# Patient Record
Sex: Male | Born: 1965 | Hispanic: Yes | Marital: Married | Smoking: Never smoker
Health system: Southern US, Community
[De-identification: ages and names within clinical notes are randomized; demographics above are authoritative.]

---

## 2020-06-25 ENCOUNTER — Emergency Department (HOSPITAL_COMMUNITY)
Admission: EM | Admit: 2020-06-25 | Discharge: 2020-06-25 | Disposition: A | Discharge status: Home / Self Care | Payer: BC Managed Care – PPO | Attending: Emergency Medicine | Admitting: Emergency Medicine

## 2020-06-25 ENCOUNTER — Other Ambulatory Visit: Payer: Self-pay

## 2020-06-25 ENCOUNTER — Emergency Department (HOSPITAL_COMMUNITY): Payer: BC Managed Care – PPO

## 2020-06-25 DIAGNOSIS — R079 Chest pain, unspecified: Secondary | ICD-10-CM | POA: Diagnosis not present

## 2020-06-25 LAB — BASIC METABOLIC PANEL
Anion gap: 10 (ref 5–15)
BUN: 16 mg/dL (ref 6–20)
CO2: 22 mmol/L (ref 22–32)
Calcium: 9.2 mg/dL (ref 8.9–10.3)
Chloride: 104 mmol/L (ref 98–111)
Creatinine, Ser: 0.86 mg/dL (ref 0.61–1.24)
GFR calc Af Amer: >60 mL/min (ref >60)
GFR calc non Af Amer: >60 mL/min (ref >60)
Glucose, Bld: 83 mg/dL (ref 70–99)
Potassium: 4.5 mmol/L (ref 3.5–5.1)
Sodium: 136 mmol/L (ref 135–145)

## 2020-06-25 LAB — CBC
HCT: 43.5 % (ref 39.0–52.0)
Hemoglobin: 14.7 g/dL (ref 13.0–17.0)
MCH: 29.1 pg (ref 26.0–34.0)
MCHC: 33.8 g/dL (ref 30.0–36.0)
MCV: 86 fL (ref 80.0–100.0)
Platelets: 178 10*3/uL (ref 150–400)
RBC: 5.06 MIL/uL (ref 4.22–5.81)
RDW: 13.3 % (ref 11.5–15.5)
WBC: 7.4 10*3/uL (ref 4.0–10.5)
nRBC: 0 % (ref 0.0–0.2)

## 2020-06-25 LAB — TROPONIN I (HIGH SENSITIVITY)
Troponin I (High Sensitivity): 2 ng/L (ref <18)
Troponin I (High Sensitivity): 3 ng/L (ref <18)

## 2020-06-25 MED ORDER — SODIUM CHLORIDE 0.9% FLUSH: 3.0000 mL | Freq: Once | INTRAVENOUS | Status: DC

## 2020-06-25 NOTE — ED Triage Notes (Signed)
Patient reports chest pain x2 days. Rated 4/10. Says pain radiates toward back. Patient says pain is worse at night.

## 2020-07-02 NOTE — ED Provider Notes (Signed)
[facility]-EMERGENCY DEPT Provider Note   CSN: [id number] Arrival date & time: 06/25/20  1010     History Chief Complaint  Patient presents with  . Chest Pain    Nicolas Grant is a 54 y.o. male.  HPI   54 year old male with chest pain.  Onset about 2 days ago.  Cannot remember what he was specifically doing when the pain started.  Pain radiates into the back.  Seems to be worse at night.  Not noticed any other appreciable exacerbating leaving factors.  No fevers or chills.  No dyspnea.  No unusual leg pain or swelling.   No past medical history on file.  There are no problems to display for this patient.  No family history on file.  Social History   Tobacco Use  . Smoking status: Not on file  Substance Use Topics  . Alcohol use: Not on file  . Drug use: Not on file    Home Medications Prior to Admission medications   Not on File    Allergies    Patient has no known allergies.  Review of Systems   Review of Systems All systems reviewed and negative, other than as noted in HPI.  Physical Exam Updated Vital Signs BP 125/88 (BP Location: Right Arm)   Pulse (!) 53   Temp 97.6 F (36.4 C) (Oral)   Resp 17   Ht 5\' 5"  (1.651 m)   Wt 72.6 kg   SpO2 98%   BMI 26.63 kg/m   Physical Exam Vitals and nursing note reviewed.  Constitutional:      General: He is not in acute distress.    Appearance: He is well-developed.  HENT:     Head: Normocephalic and atraumatic.  Eyes:     General:        Right eye: No discharge.        Left eye: No discharge.     Conjunctiva/sclera: Conjunctivae normal.  Cardiovascular:     Rate and Rhythm: Normal rate and regular rhythm.     Heart sounds: Normal heart sounds. No murmur heard.  No friction rub. No gallop.   Pulmonary:     Effort: Pulmonary effort is normal. No respiratory distress.     Breath sounds: Normal breath sounds.  Abdominal:     General: There is no distension.     Palpations:  Abdomen is soft.     Tenderness: There is no abdominal tenderness.  Musculoskeletal:        General: No tenderness.     Cervical back: Neck supple.     Comments: Lower extremities symmetric as compared to each other. No calf tenderness. Negative Homan's. No palpable cords.   Skin:    General: Skin is warm and dry.  Neurological:     Mental Status: He is alert.  Psychiatric:        Behavior: Behavior normal.        Thought Content: Thought content normal.     ED Results / Procedures / Treatments   Labs (all labs ordered are listed, but only abnormal results are displayed) Labs Reviewed  BASIC METABOLIC PANEL  CBC  TROPONIN I (HIGH SENSITIVITY)  TROPONIN I (HIGH SENSITIVITY)    EKG EKG Interpretation  Date/Time:  Sunday June 25 2020 10:29:14 EDT Ventricular Rate:  62 PR Interval:    QRS Duration: 97 QT Interval:  389 QTC Calculation: 395 R Axis:   79 Text Interpretation: Sinus rhythm No old tracing to compare Confirmed  by Raeford Razor [phone]) on 06/25/2020 11:01:06 AM   Radiology No results found.  Procedures Procedures (including critical care time)  Medications Ordered in ED Medications - No data to display  ED Course  I have reviewed the triage vital signs and the nursing notes.  Pertinent labs & imaging results that were available during my care of the patient were reviewed by me and considered in my medical decision making (see chart for details).    MDM Rules/Calculators/A&P                          54 year old male with chest pain.  Doubt ACS.  Atypical symptoms.  Doubt PE, dissection or other emergent process.  At this point I think he probably needs a stress test.  Close outpatient follow-up.  Return precautions discussed.    Final Clinical Impression(s) / ED Diagnoses Final diagnoses:  Chest pain, unspecified type    Rx / DC Orders ED Discharge Orders    None       Raeford Razor, MD 07/02/20 1228

## 2021-02-13 IMAGING — CR DG CHEST 2V
2 series · 2 of 2 positions shown · non-contrast
Comparison: None.

CLINICAL DATA: Chest pain

EXAM:
CHEST - 2 VIEW

[w chest pa]
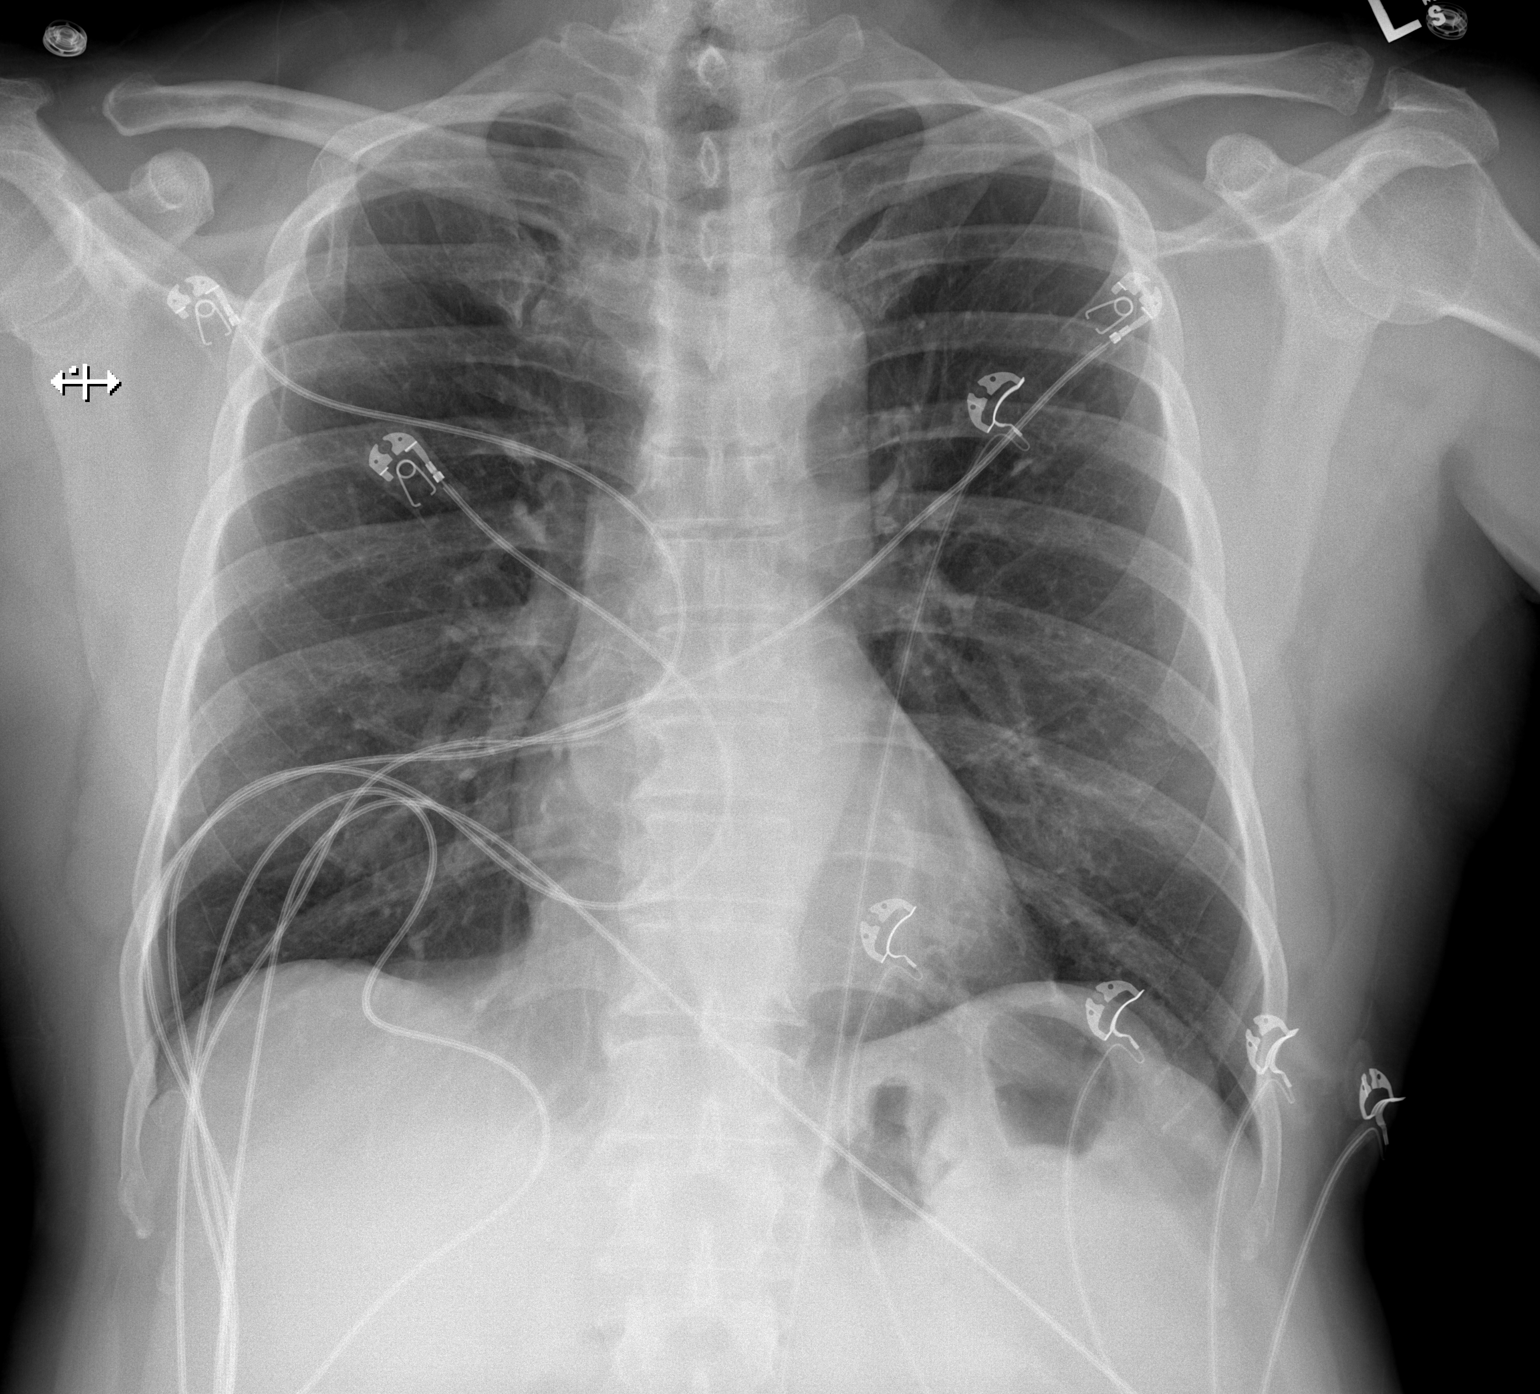

[w chest lat]
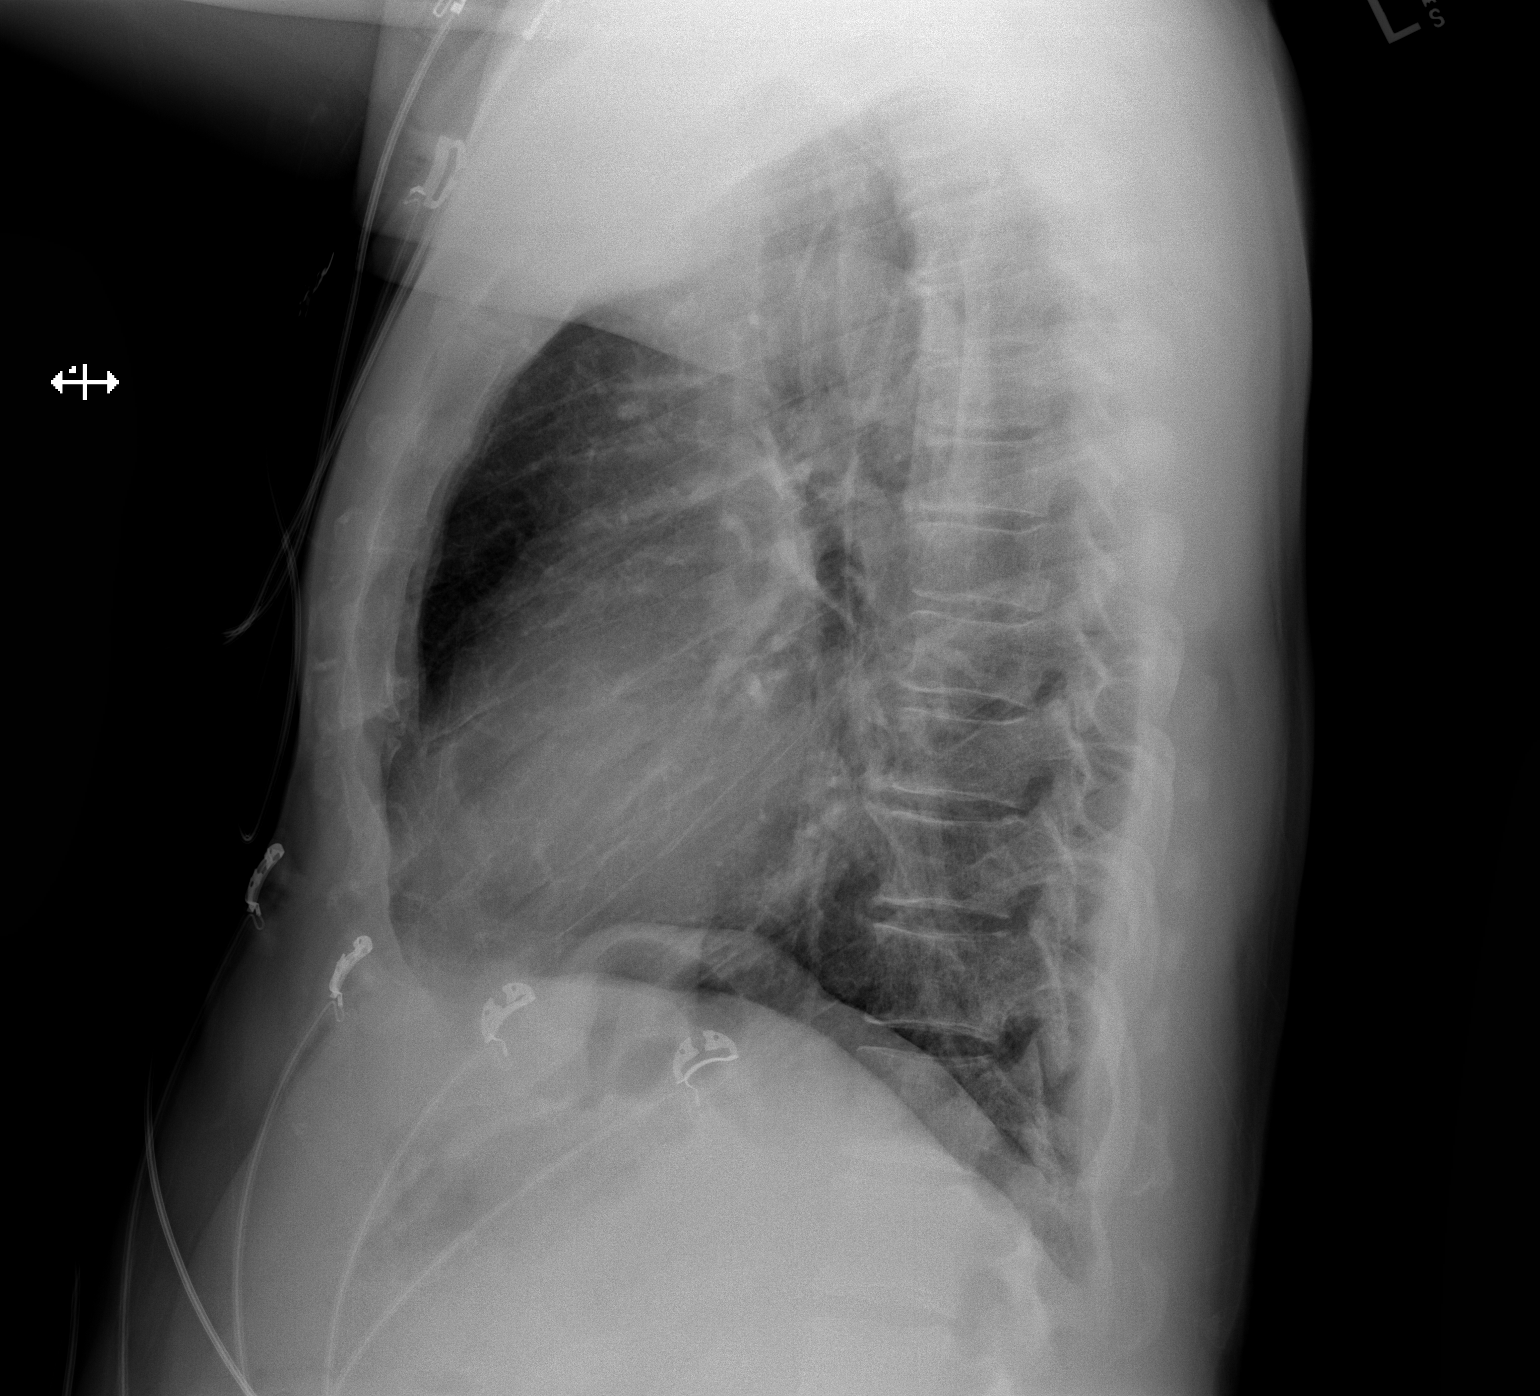

[2 of 2 positions shown; findings below may reference images not displayed]

FINDINGS: No pneumothorax. The heart, hila, mediastinum, lungs, and pleura are
normal. No acute abnormalities.
IMPRESSION: No active cardiopulmonary disease.

## 2022-11-02 ENCOUNTER — Encounter (HOSPITAL_BASED_OUTPATIENT_CLINIC_OR_DEPARTMENT_OTHER): Payer: Self-pay | Admitting: Emergency Medicine

## 2022-11-02 ENCOUNTER — Other Ambulatory Visit: Payer: Self-pay

## 2022-11-02 ENCOUNTER — Emergency Department (HOSPITAL_BASED_OUTPATIENT_CLINIC_OR_DEPARTMENT_OTHER)
Admission: EM | Admit: 2022-11-02 | Discharge: 2022-11-02 | Disposition: A | Discharge status: Home / Self Care | Payer: Commercial Managed Care - HMO | Attending: Emergency Medicine | Admitting: Emergency Medicine

## 2022-11-02 DIAGNOSIS — H6693 Otitis media, unspecified, bilateral: Secondary | ICD-10-CM | POA: Diagnosis not present

## 2022-11-02 DIAGNOSIS — H679 Otitis media in diseases classified elsewhere, unspecified ear: Secondary | ICD-10-CM

## 2022-11-02 DIAGNOSIS — H9201 Otalgia, right ear: Secondary | ICD-10-CM | POA: Diagnosis present

## 2022-11-02 DIAGNOSIS — R030 Elevated blood-pressure reading, without diagnosis of hypertension: Secondary | ICD-10-CM | POA: Diagnosis not present

## 2022-11-02 DIAGNOSIS — H6121 Impacted cerumen, right ear: Secondary | ICD-10-CM | POA: Diagnosis not present

## 2022-11-02 MED ORDER — AMOXICILLIN 500 MG PO CAPS: 500.0000 mg | ORAL_CAPSULE | Freq: Three times a day (TID) | ORAL | 0 refills | Status: AC

## 2022-11-02 NOTE — ED Provider Notes (Signed)
[place] EMERGENCY DEPT Provider Note   CSN: [id number] Arrival date & time: 11/02/22  1008     History  Chief Complaint  Patient presents with   Ear Fullness    Nicolas Grant is a 56 y.o. male.  Pt c/o bil ear fullness in past week or so, right > left. Dull, mild discomfort. No sore throat or runny nose. No headache. No trauma to ear. No drainage from ear. No tinnitus or hearing loss. No fever/chills.   The history is provided by the patient and medical records.  Ear Fullness Pertinent negatives include no headaches and no shortness of breath.       Home Medications Prior to Admission medications   Not on File      Allergies    Patient has no known allergies.    Review of Systems   Review of Systems  Constitutional:  Negative for chills and fever.  HENT:  Positive for ear pain. Negative for sore throat.   Respiratory:  Negative for cough and shortness of breath.   Gastrointestinal:  Negative for nausea and vomiting.  Musculoskeletal:  Negative for neck pain and neck stiffness.  Skin:  Negative for rash.  Neurological:  Negative for headaches.    Physical Exam Updated Vital Signs BP (!) 135/97   Pulse 71   Temp 97.9 F (36.6 C) (Oral)   Resp 20   SpO2 96%  Physical Exam Vitals and nursing note reviewed.  Constitutional:      Appearance: Normal appearance. He is well-developed.  HENT:     Head: Atraumatic.     Comments: No mastoid tenderness.     Left Ear: Ear canal and external ear normal.     Ears:     Comments: Left OM. Right EAC occluded with cerumen.     Nose: Nose normal.     Mouth/Throat:     Mouth: Mucous membranes are moist.     Pharynx: Oropharynx is clear.  Eyes:     General: No scleral icterus.    Conjunctiva/sclera: Conjunctivae normal.     Pupils: Pupils are equal, round, and reactive to light.  Neck:     Trachea: No tracheal deviation.     Comments: No stiffness or rigidity.  Pulmonary:     Effort: Pulmonary  effort is normal. No accessory muscle usage or respiratory distress.  Musculoskeletal:        General: No swelling.     Cervical back: Normal range of motion and neck supple. No rigidity.  Skin:    General: Skin is warm and dry.     Findings: No rash.  Neurological:     Mental Status: He is alert.     Comments: Alert, speech clear. Steady gait.   Psychiatric:        Mood and Affect: Mood normal.     ED Results / Procedures / Treatments   Labs (all labs ordered are listed, but only abnormal results are displayed) Labs Reviewed - No data to display  EKG None  Radiology No results found.  Procedures Procedures    Medications Ordered in ED Medications - No data to display  ED Course/ Medical Decision Making/ A&P                           Medical Decision Making Problems Addressed: Elevated blood pressure reading: acute illness or injury Impacted cerumen of right ear: acute illness or injury Otitis media in disease classified  elsewhere, unspecified laterality: acute illness or injury  Amount and/or Complexity of Data Reviewed External Data Reviewed: notes.  Risk Prescription drug management.   Confirmed nkda.   Reviewed nursing notes and prior charts for additional history.   RN to irrigate right ear.  Rx amoxicillin.  Recheck right eac clear. Tm w fluid behind tm.  Pt feels improved.   Rx for home.  Rec pcp f/u.  Return precautions provided.           Final Clinical Impression(s) / ED Diagnoses Final diagnoses:  None    Rx / DC Orders ED Discharge Orders     None         Cathren Laine, MD 11/02/22 1357

## 2022-11-02 NOTE — ED Triage Notes (Signed)
Pt feels like both ears are full, he is in [employer] and lots of debris "flying around"

## 2022-11-02 NOTE — Discharge Instructions (Addendum)
It was our pleasure to provide your ER care today - we hope that you feel better.  Take amoxicillin (antibiotic) as prescribed.   May try anti-histamine/decongestant such as zyrtec-d, or claritin-d as need for symptom relief.  Follow up with primary care doctor in 1-2 weeks for recheck if symptoms fail to improve/resolve. Also follow up with primary care doctor regarding your blood pressure which is mildly high today.   Return to ER if worse, new symptoms, new/severe pain, severe headache, or other concern.

## 2023-09-23 ENCOUNTER — Emergency Department (HOSPITAL_COMMUNITY)
Admission: EM | Admit: 2023-09-23 | Discharge: 2023-09-23 | Disposition: A | Discharge status: Home / Self Care | Payer: BLUE CROSS/BLUE SHIELD | Attending: Emergency Medicine | Admitting: Emergency Medicine

## 2023-09-23 ENCOUNTER — Other Ambulatory Visit: Payer: Self-pay

## 2023-09-23 ENCOUNTER — Encounter (HOSPITAL_COMMUNITY): Payer: Self-pay

## 2023-09-23 DIAGNOSIS — X500XXA Overexertion from strenuous movement or load, initial encounter: Secondary | ICD-10-CM | POA: Insufficient documentation

## 2023-09-23 DIAGNOSIS — S335XXA Sprain of ligaments of lumbar spine, initial encounter: Secondary | ICD-10-CM | POA: Insufficient documentation

## 2023-09-23 DIAGNOSIS — M545 Low back pain, unspecified: Secondary | ICD-10-CM | POA: Diagnosis present

## 2023-09-23 MED ORDER — IBUPROFEN 600 MG PO TABS: 600.0000 mg | ORAL_TABLET | Freq: Four times a day (QID) | ORAL | 0 refills | Status: AC | PRN

## 2023-09-23 MED ORDER — ACETAMINOPHEN 325 MG PO TABS
650.0000 mg | ORAL_TABLET | Freq: Once | ORAL | Status: AC
  Administered 2023-09-23: 650 mg via ORAL
  Filled 2023-09-23: qty 2

## 2023-09-23 MED ORDER — NAPROXEN 500 MG PO TABS
500.0000 mg | ORAL_TABLET | Freq: Once | ORAL | Status: AC
  Administered 2023-09-23: 500 mg via ORAL
  Filled 2023-09-23: qty 1

## 2023-09-23 MED ORDER — ACETAMINOPHEN ER 650 MG PO TBCR: 650.0000 mg | EXTENDED_RELEASE_TABLET | Freq: Three times a day (TID) | ORAL | 0 refills | Status: DC | PRN

## 2023-09-23 MED ORDER — METHOCARBAMOL 500 MG PO TABS: 500.0000 mg | ORAL_TABLET | Freq: Two times a day (BID) | ORAL | 0 refills | Status: AC

## 2023-09-23 NOTE — ED Provider Notes (Signed)
[place] EMERGENCY DEPARTMENT AT [facility] Provider Note   CSN: [id number] Arrival date & time: 09/23/23  1108     History  Chief Complaint  Patient presents with   Back Pain    Nicolas Grant is a 57 y.o. male.  HPI    SUBJECTIVE:  Nicolas Grant is a 57 y.o. male who complains of an injury causing low back pain 5-7 day(s) ago. The pain is positional with bending or lifting, without radiation down the legs. Mechanism of injury: Patient remembers lifting a big jug to load up the car, when he had sudden pain. Symptoms have been acute and chronic since that time.  Patient states that the pain is worse in the morning when he wakes up.  The pain is also worse when he is sitting.  He feels like something is pulling on him.  He denies any associated numbness or tingling.  Home Medications Prior to Admission medications   Medication Sig Start Date End Date Taking? Authorizing Provider  acetaminophen (TYLENOL 8 HOUR) 650 MG CR tablet Take 1 tablet (650 mg total) by mouth every 8 (eight) hours as needed for pain or fever. 09/23/23  Yes Derwood Kaplan, MD  ibuprofen (ADVIL) 600 MG tablet Take 1 tablet (600 mg total) by mouth every 6 (six) hours as needed. 09/23/23  Yes Derwood Kaplan, MD  methocarbamol (ROBAXIN) 500 MG tablet Take 1 tablet (500 mg total) by mouth 2 (two) times daily. 09/23/23  Yes Derwood Kaplan, MD  amoxicillin (AMOXIL) 500 MG capsule Take 1 capsule (500 mg total) by mouth 3 (three) times daily. 11/02/22   Cathren Laine, MD      Allergies    Patient has no known allergies.    Review of Systems   Review of Systems  Physical Exam Updated Vital Signs BP (!) 170/85 (BP Location: Left Arm)   Pulse 78   Temp 97.7 F (36.5 C) (Oral)   Resp 16   Ht 5\' 5"  (1.651 m)   Wt 72.6 kg   SpO2 97%   BMI 26.63 kg/m  Physical Exam Vitals and nursing note reviewed.  Constitutional:      Appearance: He is well-developed.  HENT:     Head: Atraumatic.   Cardiovascular:     Rate and Rhythm: Normal rate.  Pulmonary:     Effort: Pulmonary effort is normal.  Musculoskeletal:     Cervical back: Neck supple.     Comments: No reproducible tenderness with palpation of the lumbar and sacral spine  Skin:    General: Skin is warm.  Neurological:     Mental Status: He is alert and oriented to person, place, and time.     ED Results / Procedures / Treatments   Labs (all labs ordered are listed, but only abnormal results are displayed) Labs Reviewed - No data to display  EKG None  Radiology No results found.  Procedures Procedures    Medications Ordered in ED Medications  naproxen (NAPROSYN) tablet 500 mg (has no administration in time range)  acetaminophen (TYLENOL) tablet 650 mg (has no administration in time range)    ED Course/ Medical Decision Making/ A&P                                 Medical Decision Making Risk OTC drugs. Prescription drug management.   57 year old male comes in with chief complaint of back pain.  He states that he  was moving something, when he had a sudden pain in his back.  The pain has been persistent since then.  Pain is worse in the morning when he wakes up, and gradually improves.  Pain is also better when he is standing compared to when he is sitting.  Differential diagnosis considered for this patient includes: - DJD of the back - Spondylitises/ spondylosis - Sciatica - Spinal cord compression - Conus medullaris - Epidural hematoma - Epidural abscess - Lytic/pathologic fracture - Myelitis - Musculoskeletal pain  Given that patient clearly has a precipitating factor that is traumatic, we will not proceed with x-ray.  Patient is comfortable with the plan of conservative management from the ED.  He will follow-up with a primary care doctor or chiropractor if his symptoms continue beyond 2 weeks.  He will return to the ER if he starts having any worsening of his symptoms or any neurologic  symptoms.  ASSESSMENT:  lumbar strain  PLAN: For acute pain, rest, intermittent application of cold packs (later, may switch to heat, but do not sleep on heating pad), analgesics and muscle relaxants are recommended. Discussed longer term treatment plan of prn NSAID's and discussed a home back care exercise program with flexion exercise routine. Proper lifting with avoidance of heavy lifting discussed. Consider Physical Therapy and XRay studies if not improving. Call or return to clinic prn if these symptoms worsen or fail to improve as anticipated. Final Clinical Impression(s) / ED Diagnoses Final diagnoses:  Lumbar back sprain, initial encounter    Rx / DC Orders ED Discharge Orders          Ordered    ibuprofen (ADVIL) 600 MG tablet  Every 6 hours PRN        09/23/23 1305    acetaminophen (TYLENOL 8 HOUR) 650 MG CR tablet  Every 8 hours PRN        09/23/23 1305    methocarbamol (ROBAXIN) 500 MG tablet  2 times daily        09/23/23 1305              Derwood Kaplan, MD 09/23/23 1718

## 2023-09-23 NOTE — ED Triage Notes (Signed)
Lower back pain that started a few days ago after picking up a case of water. Pt states pain has been worsening. Denies difficulty urinating

## 2024-03-21 ENCOUNTER — Other Ambulatory Visit: Payer: Self-pay

## 2024-03-21 ENCOUNTER — Emergency Department (HOSPITAL_COMMUNITY)
Admission: EM | Admit: 2024-03-21 | Discharge: 2024-03-21 | Disposition: A | Discharge status: Home / Self Care | Payer: Self-pay | Attending: Emergency Medicine | Admitting: Emergency Medicine

## 2024-03-21 ENCOUNTER — Encounter (HOSPITAL_COMMUNITY): Payer: Self-pay

## 2024-03-21 DIAGNOSIS — H60502 Unspecified acute noninfective otitis externa, left ear: Secondary | ICD-10-CM

## 2024-03-21 DIAGNOSIS — H60532 Acute contact otitis externa, left ear: Secondary | ICD-10-CM | POA: Insufficient documentation

## 2024-03-21 MED ORDER — NEOMYCIN-POLYMYXIN-HC 3.5-10000-1 OT SUSP
4.0000 [drp] | Freq: Four times a day (QID) | OTIC | Status: DC
  Administered 2024-03-21: 4 [drp] via OTIC
  Filled 2024-03-21: qty 10

## 2024-03-21 NOTE — Discharge Instructions (Addendum)
 Please put the 4 drops, in your ear, 3 times today, for the next 7 days.  Return to the ER if you have any kind of loss of hearing, severe pain.  Stop using Q-tips, I believe this scratched your ear.

## 2024-03-21 NOTE — ED Triage Notes (Signed)
 Pt woke up this AM and noticed blood was coming out of his L ear. He denies any otalgia, trauma, or recent illness. He does report the L ear has been pruritic for 2 weeks.

## 2024-03-21 NOTE — ED Provider Notes (Signed)
 [place] EMERGENCY DEPARTMENT AT [facility] Provider Note   CSN: [id number] Arrival date & time: 03/21/24  1241     History  Chief Complaint  Patient presents with   Ear Drainage    Geofrey Silliman is a 58 y.o. male, no pertinent past medical history, who presents to the ED secondary to left ear pain, has been going on for last couple weeks.  He states that his skin is itchy, and tender.  Has been gone for last couple weeks, and he has been using Q-tips to clean out his ears, noticed today that his ear was bleeding.  Denies any hearing loss, redness or swelling of his ear.  Has not had any other changes.   Home Medications Prior to Admission medications   Medication Sig Start Date End Date Taking? Authorizing Provider  acetaminophen (TYLENOL 8 HOUR) 650 MG CR tablet Take 1 tablet (650 mg total) by mouth every 8 (eight) hours as needed for pain or fever. 09/23/23   Derwood Kaplan, MD  amoxicillin (AMOXIL) 500 MG capsule Take 1 capsule (500 mg total) by mouth 3 (three) times daily. 11/02/22   Cathren Laine, MD  ibuprofen (ADVIL) 600 MG tablet Take 1 tablet (600 mg total) by mouth every 6 (six) hours as needed. 09/23/23   Derwood Kaplan, MD  methocarbamol (ROBAXIN) 500 MG tablet Take 1 tablet (500 mg total) by mouth 2 (two) times daily. 09/23/23   Derwood Kaplan, MD      Allergies    Patient has no known allergies.    Review of Systems   Review of Systems  Eyes:  Positive for pain and itching. Negative for discharge.    Physical Exam Updated Vital Signs BP (!) 139/90 (BP Location: Right Arm)   Pulse 61   Temp 97.9 F (36.6 C) (Oral)   Resp 16   Ht 5\' 5"  (1.651 m)   Wt 72.6 kg   SpO2 99%   BMI 26.63 kg/m  Physical Exam Vitals and nursing note reviewed.  Constitutional:      General: He is not in acute distress.    Appearance: He is well-developed.  HENT:     Head: Normocephalic and atraumatic.     Right Ear: Tympanic membrane, ear canal and external ear  normal. There is no impacted cerumen.     Left Ear: Tympanic membrane normal. There is no impacted cerumen.     Ears:     Comments: TM within normal limits. Slight amount of white exudate in canal with area of excoriation of L canal. No overlying erythema or edema of the ears. No mastoid ttp.     Nose: Nose normal.  Eyes:     General:        Right eye: No discharge.        Left eye: No discharge.     Conjunctiva/sclera: Conjunctivae normal.  Pulmonary:     Effort: No respiratory distress.  Neurological:     Mental Status: He is alert.     Comments: Clear speech.   Psychiatric:        Behavior: Behavior normal.        Thought Content: Thought content normal.     ED Results / Procedures / Treatments   Labs (all labs ordered are listed, but only abnormal results are displayed) Labs Reviewed - No data to display  EKG None  Radiology No results found.  Procedures Procedures    Medications Ordered in ED Medications  neomycin-polymyxin-hydrocortisone (CORTISPORIN) OTIC (  EAR) suspension 4 drop (4 drops Left EAR Given 03/21/24 1311)    ED Course/ Medical Decision Making/ A&P                                 Medical Decision Making Patient is a 58 year old male, here for blood, feeling out of his left ear for the last day.  He states has been using Q-tips, is ears been more itchy.  He has some exudate in the canal, and has some blood in the canal.  However TM is within normal limits.  Given excoriation, and exudate in the canal, concern for possible otitis externa, secondary to infection, which led to excoriation, versus dermatitis, of the canal.  Will start on polymyxin, hydrocortisone, solution, for treatment, and follow-up with primary care doctor.  Discharged home with strict return precautions.  TM within normal limits, no evidence of any kind of overt global infection  Risk Prescription drug management.    Final Clinical Impression(s) / ED Diagnoses Final diagnoses:   Acute otitis externa of left ear, unspecified type    Rx / DC Orders ED Discharge Orders     None         Kirstyn Lean, Harley [place], PA 03/21/24 1318    Pricilla Loveless, MD 03/21/24 1444

## 2024-03-21 NOTE — ED Notes (Signed)
 Pt provided discharge instructions and prescription information. Pt was given the opportunity to ask questions and questions were answered.

## 2024-06-02 ENCOUNTER — Emergency Department (HOSPITAL_COMMUNITY)
Admission: EM | Admit: 2024-06-02 | Discharge: 2024-06-02 | Disposition: A | Discharge status: Home / Self Care | Attending: Emergency Medicine | Admitting: Emergency Medicine

## 2024-06-02 DIAGNOSIS — M79671 Pain in right foot: Secondary | ICD-10-CM | POA: Insufficient documentation

## 2024-06-02 MED ORDER — ACETAMINOPHEN ER 650 MG PO TBCR: 650.0000 mg | EXTENDED_RELEASE_TABLET | Freq: Three times a day (TID) | ORAL | 0 refills | Status: AC | PRN

## 2024-06-02 NOTE — Discharge Instructions (Addendum)
 Please continue with foot massage, as well as soaking your feet in some warm water with Epsom salts several times weekly to aid with foot pain.  Pain is likely due to arthritis.  You may take Tylenol  as needed for pain. Follow-up with podiatrist for further care.  You may benefit from wearing shoes with good arch support and with good cushion for comfort.

## 2024-06-02 NOTE — ED Triage Notes (Signed)
 Pt arrived reporting R foot pain X a few days. Patient denies any known injury. No open wound to foot. No obvious inj. States hurts after long walking

## 2024-06-02 NOTE — ED Provider Notes (Signed)
 [place] EMERGENCY DEPARTMENT AT [facility] Provider Note   CSN: [id number] Arrival date & time: 06/02/24  1191     Patient presents with: Foot Pain   Nicolas Grant is a 58 y.o. male.   The history is provided by the patient and medical records. No language interpreter was used.  Foot Pain     58 year old male presenting with complaint of right foot pain.  Patient states for nearly a month he has had pain to his right foot.  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  In the morning pain is mild.  He tried massaging it some Tylenol  with some improvement and no other specific treatment tried.  He denies any injury denies any numbness or weakness denies any ankle or knee pain he denies any change in orthotic wear.  No history of diabetes.  Currently rates pain as 4 out of 10.  Prior to Admission medications   Medication Sig Start Date End Date Taking? Authorizing Provider  acetaminophen  (TYLENOL  8 HOUR) 650 MG CR tablet Take 1 tablet (650 mg total) by mouth every 8 (eight) hours as needed for pain or fever. 09/23/23   Deatra Face, MD  amoxicillin  (AMOXIL ) 500 MG capsule Take 1 capsule (500 mg total) by mouth 3 (three) times daily. 11/02/22   Steinl, Kevin, MD  ibuprofen  (ADVIL ) 600 MG tablet Take 1 tablet (600 mg total) by mouth every 6 (six) hours as needed. 09/23/23   Deatra Face, MD  methocarbamol  (ROBAXIN ) 500 MG tablet Take 1 tablet (500 mg total) by mouth 2 (two) times daily. 09/23/23   Deatra Face, MD    Allergies: Patient has no known allergies.    Review of Systems  Musculoskeletal:  Positive for arthralgias.  Skin:  Negative for wound.    Updated Vital Signs BP 120/84 (BP Location: Left Arm)   Pulse 65   Temp 98.5 F (36.9 C) (Oral)   Resp 17   SpO2 97%   Physical Exam Constitutional:      General: He is not in  acute distress.    Appearance: He is well-developed.  HENT:     Head: Atraumatic.   Eyes:     Conjunctiva/sclera: Conjunctivae normal.    Musculoskeletal:        General: Tenderness (Right foot: Mild tenderness noted to mid foot without any bruising no crepitus no deformity no swelling no erythema.  Intact DP pulse with brisk cap refill.) present.     Cervical back: Normal range of motion and neck supple.     Comments: Right ankle nontender with full range of motion.   Skin:    Findings: No rash.   Neurological:     Mental Status: He is alert.     (all labs ordered are listed, but only abnormal results are displayed) Labs Reviewed - No data to display  EKG: None  Radiology: No results found.   Procedures   Medications Ordered in the ED - No data to display                                  Medical Decision Making  BP 120/84 (BP Location: Left Arm)   Pulse 65   Temp 98.5 F (36.9 C) (Oral)   Resp 17  SpO2 97%   29:15 AM  58 year old male presenting with complaint of right foot pain.  Patient states for nearly a month he has had pain to his right foot.  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  In the morning pain is mild.  He tried massaging it some Tylenol  with some improvement and no other specific treatment tried.  He denies any injury denies any numbness or weakness denies any ankle or knee pain he denies any change in orthotic wear.  No history of diabetes.  Currently rates pain as 4 out of 10.  On exam patient has mild tenderness noted to the dorsum of his right foot without signs of trauma.  There is no evidence to suggest cellulitis or septic joints.  There is no swelling no other concerning feature.  Right ankle with full range of motion.  Patient is neurovascular intact.  He is able to bear weight without  difficulty.  Suspect pain is likely arthralgia and less likely to be infectious cause.  Imaging including x-ray considered but not performed as it is likely low acuity patient agrees.  I recommend RICE therapy and will provide patient with podiatry follow-up as needed.     Final diagnoses:  Foot pain, right    ED Discharge Orders          Ordered    acetaminophen  (TYLENOL  8 HOUR) 650 MG CR tablet  Every 8 hours PRN        06/02/24 1043               Debbra Fairy, PA-C 06/02/24 1045    Wynetta Heckle, MD 06/02/24 1358
